# Patient Record
Sex: Male | Born: 1955 | Race: White | Hispanic: No | Marital: Married | Smoking: Current every day smoker
Health system: Southern US, Community
[De-identification: ages and names within clinical notes are randomized; demographics above are authoritative.]

## PROBLEM LIST (undated history)

## (undated) DIAGNOSIS — R7303 Prediabetes: Secondary | ICD-10-CM

## (undated) DIAGNOSIS — N529 Male erectile dysfunction, unspecified: Secondary | ICD-10-CM

## (undated) DIAGNOSIS — E785 Hyperlipidemia, unspecified: Secondary | ICD-10-CM

## (undated) DIAGNOSIS — K635 Polyp of colon: Secondary | ICD-10-CM

## (undated) DIAGNOSIS — K219 Gastro-esophageal reflux disease without esophagitis: Secondary | ICD-10-CM

## (undated) HISTORY — DX: Prediabetes: R73.03

## (undated) HISTORY — DX: Male erectile dysfunction, unspecified: N52.9

## (undated) HISTORY — DX: Hyperlipidemia, unspecified: E78.5

## (undated) HISTORY — DX: Polyp of colon: K63.5

## (undated) HISTORY — DX: Gastro-esophageal reflux disease without esophagitis: K21.9

---
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
## 2001-09-24 ENCOUNTER — Encounter: Payer: Self-pay | Admitting: Emergency Medicine

## 2001-09-24 ENCOUNTER — Emergency Department (HOSPITAL_COMMUNITY): Admission: EM | Admit: 2001-09-24 | Discharge: 2001-09-24 | Payer: Self-pay | Admitting: Emergency Medicine

## 2012-10-10 ENCOUNTER — Ambulatory Visit (INDEPENDENT_AMBULATORY_CARE_PROVIDER_SITE_OTHER): Payer: 59 | Admitting: Internal Medicine

## 2012-10-10 ENCOUNTER — Encounter: Payer: Self-pay | Admitting: Internal Medicine

## 2012-10-10 VITALS — BP 136/84 | HR 62 | Temp 97.7°F | Ht 69.5 in | Wt 243.0 lb

## 2012-10-10 DIAGNOSIS — E785 Hyperlipidemia, unspecified: Secondary | ICD-10-CM

## 2012-10-10 DIAGNOSIS — F17200 Nicotine dependence, unspecified, uncomplicated: Secondary | ICD-10-CM

## 2012-10-10 DIAGNOSIS — K219 Gastro-esophageal reflux disease without esophagitis: Secondary | ICD-10-CM

## 2012-10-10 DIAGNOSIS — Z720 Tobacco use: Secondary | ICD-10-CM

## 2012-10-10 DIAGNOSIS — K635 Polyp of colon: Secondary | ICD-10-CM | POA: Insufficient documentation

## 2012-10-10 DIAGNOSIS — D126 Benign neoplasm of colon, unspecified: Secondary | ICD-10-CM

## 2012-10-10 DIAGNOSIS — N529 Male erectile dysfunction, unspecified: Secondary | ICD-10-CM | POA: Insufficient documentation

## 2012-10-10 MED ORDER — VARENICLINE TARTRATE 1 MG PO TABS: 1.0000 mg | ORAL_TABLET | Freq: Two times a day (BID) | ORAL | Status: DC

## 2012-10-10 MED ORDER — SILDENAFIL CITRATE 100 MG PO TABS: 50.0000 mg | ORAL_TABLET | Freq: Every day | ORAL | Status: DC | PRN

## 2012-10-10 MED ORDER — VARENICLINE TARTRATE 0.5 MG X 11 & 1 MG X 42 PO MISC: ORAL | Status: DC

## 2012-10-10 NOTE — Assessment & Plan Note (Signed)
He has a family history of colon polyps, reports he had a total of 4 colonoscopies before, due for one. Will refer to his previous GI in [facility] gastroenterology.

## 2012-10-10 NOTE — Assessment & Plan Note (Signed)
Has used Viagra before without problems, request a refill: done

## 2012-10-10 NOTE — Assessment & Plan Note (Signed)
Patient used Chantix before with minimal side effects, it did help. Would like to have a prescription to use when he is ready. Prescription issued.

## 2012-10-10 NOTE — Patient Instructions (Addendum)
Please sign a ROI and send it to your previous MD, get all records Next visit 4 months, fasting for a physical Quit tobacco 2 weeks after start chantix, ok to take chantix for 3 months; call if side effects

## 2012-10-10 NOTE — Assessment & Plan Note (Addendum)
GERD symptoms well controlled. Recommend to take Prevacid in the morning before breakfast Instead of taking it at night. Reports that previously had a EGD the "showed no Barrett esophagus".

## 2012-10-10 NOTE — Progress Notes (Signed)
  Subjective:    Patient ID: Steven Mcdaniel, male    DOB: [birth year], 57 y.o.   MRN: [id number]  HPI  New patient, here to get established, we went over his medical issues: GERD, symptoms well-controlled with Prevacid at night. History of ED, previously tried Viagra without problems, would like a prescription. History of mildly elevated cholesterol, at some point took Lipitor. His or colon polyps, thinks he is due for a colonoscopy. Ongoing tobacco abuse, tried Chantix before, would like a prescription, reports it worked for him w/ no major side effects except for some "weird dreams"..  Past Medical History  Diagnosis Date  . GERD (gastroesophageal reflux disease)   . Colon polyp   . Erectile dysfunction   . Mild hyperlipidemia    Past Surgical History  Procedure Laterality Date  . Tonsillectomy and adenoidectomy  1968   History   Social History  . Marital Status: Married    Spouse Name: N/A    Number of Children: 4  . Years of Education: N/A   Occupational History  . Administrator, Civil Service, Financial trader     Social History Main Topics  . Smoking status: Current Every Day Smoker  . Smokeless tobacco: Never Used     Comment: 3/4 ppd  . Alcohol Use: No  . Drug Use: No  . Sexually Active: Not on file   Other Topics Concern  . Not on file   Social History Narrative  . No narrative on file   Family History  Problem Relation Age of Onset  . Colon cancer Other     GM age 66 ?  Marland Kitchen Prostate cancer Neg Hx   . Diabetes Brother   . CAD Father     CABG at age 61s   Review of Systems Denies nausea, vomiting, diarrhea. No blood in the stools. No dysphasia or odynophagia.    Objective:   Physical Exam BP 136/84  Pulse 62  Temp(Src) 97.7 F (36.5 C) (Oral)  Ht 5' 9.5" (1.765 m)  Wt 243 lb (110.224 kg)  BMI 35.38 kg/m2  SpO2 95%  General -- alert, well-developed, No apparent distress   Neck --no thyromegaly , normal carotid pulse Lungs -- normal respiratory effort, no  intercostal retractions, no accessory muscle use, and normal breath sounds.   Heart-- normal rate, regular rhythm, no murmur, and no gallop.   Abdomen--soft, non-tender, no distention, no masses, no HSM, no guarding, and no rigidity.   Extremities-- no pretibial edema bilaterally  Neurologic-- alert & oriented X3 and strength normal in all extremities. Psych-- Cognition and judgment appear intact. Alert and cooperative with normal attention span and concentration.  not anxious appearing and not depressed appearing.          Assessment & Plan:

## 2012-10-11 ENCOUNTER — Encounter: Payer: Self-pay | Admitting: Internal Medicine

## 2012-10-21 ENCOUNTER — Encounter: Payer: 59 | Admitting: Internal Medicine

## 2012-11-07 ENCOUNTER — Encounter: Payer: 59 | Admitting: Internal Medicine

## 2013-01-01 ENCOUNTER — Encounter: Payer: Self-pay | Admitting: Internal Medicine

## 2013-02-23 ENCOUNTER — Encounter: Payer: 59 | Admitting: Internal Medicine

## 2013-03-05 ENCOUNTER — Telehealth: Payer: Self-pay

## 2013-03-05 NOTE — Telephone Encounter (Signed)
LVM for [facility] needs updating

## 2013-03-06 ENCOUNTER — Ambulatory Visit (INDEPENDENT_AMBULATORY_CARE_PROVIDER_SITE_OTHER)
Admission: RE | Admit: 2013-03-06 | Discharge: 2013-03-06 | Disposition: A | Discharge status: Home / Self Care | Payer: 59 | Source: Ambulatory Visit | Attending: Internal Medicine | Admitting: Internal Medicine

## 2013-03-06 ENCOUNTER — Ambulatory Visit (INDEPENDENT_AMBULATORY_CARE_PROVIDER_SITE_OTHER): Payer: 59 | Admitting: Internal Medicine

## 2013-03-06 ENCOUNTER — Encounter: Payer: Self-pay | Admitting: Internal Medicine

## 2013-03-06 VITALS — BP 143/93 | HR 65 | Temp 98.1°F | Wt 248.0 lb

## 2013-03-06 DIAGNOSIS — Z0000 Encounter for general adult medical examination without abnormal findings: Secondary | ICD-10-CM | POA: Insufficient documentation

## 2013-03-06 DIAGNOSIS — R059 Cough, unspecified: Secondary | ICD-10-CM

## 2013-03-06 DIAGNOSIS — Z720 Tobacco use: Secondary | ICD-10-CM

## 2013-03-06 DIAGNOSIS — R05 Cough: Secondary | ICD-10-CM

## 2013-03-06 DIAGNOSIS — Z23 Encounter for immunization: Secondary | ICD-10-CM

## 2013-03-06 DIAGNOSIS — R079 Chest pain, unspecified: Secondary | ICD-10-CM | POA: Insufficient documentation

## 2013-03-06 LAB — CBC WITH DIFFERENTIAL/PLATELET
Basophils Absolute: 0 10*3/uL (ref 0.0–0.1)
Basophils Relative: 0.4 % (ref 0.0–3.0)
Eosinophils Absolute: 0.4 10*3/uL (ref 0.0–0.7)
Eosinophils Relative: 3.6 % (ref 0.0–5.0)
HCT: 48.3 % (ref 39.0–52.0)
Hemoglobin: 16.6 g/dL (ref 13.0–17.0)
Lymphocytes Relative: 19.9 % (ref 12.0–46.0)
Lymphs Abs: 2 10*3/uL (ref 0.7–4.0)
MCHC: 34.3 g/dL (ref 30.0–36.0)
MCV: 95.1 fl (ref 78.0–100.0)
Monocytes Absolute: 0.4 10*3/uL (ref 0.1–1.0)
Monocytes Relative: 4 % (ref 3.0–12.0)
Neutro Abs: 7.3 10*3/uL (ref 1.4–7.7)
Neutrophils Relative %: 72.1 % (ref 43.0–77.0)
Platelets: 243 10*3/uL (ref 150.0–400.0)
RBC: 5.08 Mil/uL (ref 4.22–5.81)
RDW: 13.6 % (ref 11.5–14.6)
WBC: 10.1 10*3/uL (ref 4.5–10.5)

## 2013-03-06 LAB — COMPREHENSIVE METABOLIC PANEL
ALT: 23 U/L (ref 0–53)
AST: 21 U/L (ref 0–37)
Albumin: 3.9 g/dL (ref 3.5–5.2)
Alkaline Phosphatase: 77 U/L (ref 39–117)
BUN: 16 mg/dL (ref 6–23)
CO2: 25 mEq/L (ref 19–32)
Calcium: 9 mg/dL (ref 8.4–10.5)
Chloride: 108 mEq/L (ref 96–112)
Creatinine, Ser: 0.9 mg/dL (ref 0.4–1.5)
GFR: 96.67 mL/min (ref >60.00)
Glucose, Bld: 104 mg/dL — ABNORMAL HIGH (ref 70–99)
Potassium: 4.1 mEq/L (ref 3.5–5.1)
Sodium: 139 mEq/L (ref 135–145)
Total Bilirubin: 0.8 mg/dL (ref 0.3–1.2)
Total Protein: 6.8 g/dL (ref 6.0–8.3)

## 2013-03-06 LAB — LIPID PANEL
Cholesterol: 219 mg/dL — ABNORMAL HIGH (ref 0–200)
HDL: 39.7 mg/dL (ref >39.00)
Total CHOL/HDL Ratio: 6
Triglycerides: 111 mg/dL (ref 0.0–149.0)
VLDL: 22.2 mg/dL (ref 0.0–40.0)

## 2013-03-06 LAB — LDL CHOLESTEROL, DIRECT: Direct LDL: 169.2 mg/dL

## 2013-03-06 LAB — HEMOGLOBIN A1C: Hgb A1c MFr Bld: 6.2 % (ref 4.6–6.5)

## 2013-03-06 LAB — TSH: TSH: 1.08 u[IU]/mL (ref 0.35–5.50)

## 2013-03-06 LAB — PSA: PSA: 1.4 ng/mL (ref 0.10–4.00)

## 2013-03-06 MED ORDER — BUPROPION HCL 100 MG PO TABS: 100.0000 mg | ORAL_TABLET | Freq: Two times a day (BID) | ORAL | Status: DC

## 2013-03-06 NOTE — Assessment & Plan Note (Addendum)
Tdap-- today Flu shot today PNM shot-- will provide one (smoker) next visit H/o previous colonoscopies, we referred to GI, see answer below: [facility] [phone]) pt is not due until 05/2014 for colon.Spoke with pt and he is aware that he's will be receiving reminder in mail when he is due and that Dr Donnie Coffin retired in aug 2013 Labs including a  A1c patient request. Patient is overweight, sedentary, smokes, has a family history of heart disease, he is high risk for cardiovascular events, patient aware of that.  Start aspirin 81.

## 2013-03-06 NOTE — Patient Instructions (Signed)
Get your blood work before you leave  Next visit in  3-4 months  for a check up Please make an appointment before you leave the office today (or call few weeks in advance)  Please get your x-ray at the other [place]  office located at: [address] [place], across from [facility].  Please go to the basement, this is a walk-in facility, they are open from 8:30 to 5:30 PM. Phone number [phone].

## 2013-03-06 NOTE — Progress Notes (Signed)
  Subjective:    Patient ID: Steven Mcdaniel, male    DOB: 09/20/1955, 57 y.o.   MRN: [id number]  HPI  Complete physical exam In addition we discuss tobacco cessation, chest pain, cough  Past Medical History  Diagnosis Date  . GERD (gastroesophageal reflux disease)   . Colon polyp   . Erectile dysfunction   . Mild hyperlipidemia    Past Surgical History  Procedure Laterality Date  . Tonsillectomy and adenoidectomy  1968   History   Social History  . Marital Status: Married    Spouse Name: N/A    Number of Children: 4  . Years of Education: N/A   Occupational History  . Administrator, Civil Service, Financial trader     Social History Main Topics  . Smoking status: Current Every Day Smoker  . Smokeless tobacco: Never Used     Comment: 3/4 ppd  . Alcohol Use: No  . Drug Use: No  . Sexual Activity: Not on file   Other Topics Concern  . Not on file   Social History Narrative  . No narrative on file   Family History  Problem Relation Age of Onset  . Colon cancer Other     GM age 45 ?  Marland Kitchen Prostate cancer Neg Hx   . Diabetes Brother   . CAD Father     CABG at age 40s     Review of Systems Diet-- "not good" Exercise-- not as active lately but takes walks w/ wife 3-4 times per week Occasional chest pain, anteriorly, left or right, going on for 2 or 3 years, decrease with a "massaging"  the area.No exertional symptoms No SOB, lower extremity edema  No nausea, vomiting diarrhea No blood in the stools +  Cough "all day long" , mild sputum production, white-yellow occ  Wheezing,"rateling". No hemoptysis No dysuria, gross hematuria, difficulty urinating         Objective:   Physical Exam BP 143/93  Pulse 65  Temp(Src) 98.1 F (36.7 C)  Wt 248 lb (112.492 kg)  BMI 36.11 kg/m2  SpO2 96% General -- alert, well-developed, NAD.  Neck --no thyromegaly , normal carotid pulse  Lungs -- normal respiratory effort, no intercostal retractions, no accessory muscle use, and normal  breath sounds.  Heart-- normal rate, regular rhythm, no murmur.  Abdomen-- Not distended, good bowel sounds,soft, slt tender at the distal RLQ w/o mass-rebound. Noorganomegaly.  Rectal-- No external abnormalities noted. Normal sphincter tone. No rectal masses or tenderness. Brown stool  Prostate--Prostate gland firm and smooth, no enlargement, nodularity, tenderness, mass, asymmetry or induration. Extremities-- no pretibial edema bilaterally  Neurologic--  alert & oriented X3.  Speech normal, gait normal, strength normal in all extremities.   Psych-- Cognition and judgment appear intact. Cooperative with normal attention span and concentration. No anxious appearing , no depressed appearing.       Assessment & Plan:

## 2013-03-06 NOTE — Assessment & Plan Note (Signed)
Chest pain is quite atypical, EKG: i RBBB. He has multiple cardiovascular risk factors and request a stress test: Will arrange

## 2013-03-06 NOTE — Telephone Encounter (Signed)
Unable to reach pre visit.  

## 2013-03-06 NOTE — Assessment & Plan Note (Addendum)
Unable to afford Chantix due to cost We talked about  Wellbutrin, the patch. Elected welbutrin, Prescription provided, how to use the medication discussed as well

## 2013-03-07 ENCOUNTER — Encounter: Payer: Self-pay | Admitting: Internal Medicine

## 2013-03-09 ENCOUNTER — Telehealth: Payer: Self-pay | Admitting: *Deleted

## 2013-03-09 NOTE — Telephone Encounter (Signed)
Pt notified via tele of lab results. Pt states does not want referral to nutritionist, has a check up scheduled jan. 2015. DJR

## 2013-03-09 NOTE — Telephone Encounter (Signed)
Message copied by Eustace Quail on Mon Mar 09, 2013  2:53 PM ------      Message from: Wanda Plump      Created: Sat Mar 07, 2013  2:49 PM       Please call patient:      His liver, kidney, potassium, thyroid and prostate tests are all normal.      Chest x-ray is normal as well.      The A1c test did show  mild diabetes and his LDL or bad cholesterol is 147,WG should be close to 100.      I don't think he needs medication for his mild diabetes and high cholesterol  but definitely needs to improve his diet and exercise. If he needs to be referred to a nutritionist, please arrange. Otherwise I'll see him in 3 or 4 months for a checkup.      Marland Kitchen ------

## 2013-03-25 ENCOUNTER — Encounter: Payer: Self-pay | Admitting: Internal Medicine

## 2013-04-02 ENCOUNTER — Other Ambulatory Visit: Payer: Self-pay | Admitting: Internal Medicine

## 2013-04-02 ENCOUNTER — Ambulatory Visit (INDEPENDENT_AMBULATORY_CARE_PROVIDER_SITE_OTHER): Payer: 59 | Admitting: Internal Medicine

## 2013-04-02 DIAGNOSIS — R079 Chest pain, unspecified: Secondary | ICD-10-CM

## 2013-04-02 DIAGNOSIS — R05 Cough: Secondary | ICD-10-CM

## 2013-04-02 DIAGNOSIS — R059 Cough, unspecified: Secondary | ICD-10-CM

## 2013-04-02 LAB — PULMONARY FUNCTION TEST

## 2013-04-02 NOTE — Progress Notes (Signed)
PFT done today. 

## 2013-04-11 ENCOUNTER — Telehealth: Payer: Self-pay | Admitting: Internal Medicine

## 2013-04-11 NOTE — Telephone Encounter (Signed)
Advise patient, PFTs from 04/02/2013 normal. Good results

## 2013-04-13 NOTE — Telephone Encounter (Signed)
Pt notified. DJR  

## 2013-05-14 ENCOUNTER — Ambulatory Visit (INDEPENDENT_AMBULATORY_CARE_PROVIDER_SITE_OTHER): Payer: 59 | Admitting: Physician Assistant

## 2013-05-14 DIAGNOSIS — R079 Chest pain, unspecified: Secondary | ICD-10-CM

## 2013-05-14 NOTE — Progress Notes (Signed)
Exercise Treadmill Test  Steven Mcdaniel is a 57 y.o. male smoker with HL referred for ETT due to CP.  CP occurs with eating.  No exertional CP.  No DOE. No syncope.  Exam unremarkable.  ECG:  NSR, no ST changes.  Pre-Exercise Testing Evaluation Rhythm: normal sinus  Rate: 70 bpm     Test  Exercise Tolerance Test Ordering MD: Angelina Sheriff, MD  Interpreting MD: Tereso Newcomer PA-C  Unique Test No: 1  Treadmill:  1  Indication for ETT: chest pain - rule out ischemia  Contraindication to ETT: No   Stress Modality: exercise - treadmill  Cardiac Imaging Performed: non   Protocol: standard Bruce - maximal  Max BP:  207/87  Max MPHR (bpm):  161 85% MPR (bpm):  137  MPHR obtained (bpm):  171 % MPHR obtained:  106  Reached 85% MPHR (min:sec):  7:17 Total Exercise Time (min-sec):  8:14  Workload in METS:  9.9 Borg Scale: 15  Reason ETT Terminated:  SVT    ST Segment Analysis At Rest: normal ST segments - no evidence of significant ST depression With Exercise: non-specific ST changes  Other Information Arrhythmia:  Yes Angina during ETT:  absent (0) Quality of ETT:  diagnostic  ETT Interpretation:  normal - no evidence of ischemia by ST analysis  Comments: Good exercise capacity. No chest pain. Normal BP response to exercise. No ST changes to suggest ischemia.  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  Recommendations: F/u with Willow Ora, MD as directed. Consider event monitor if patient has episodes of rapid palpitations. Signed,  Tereso Newcomer, PA-C   05/14/2013 5:12 PM

## 2013-05-15 ENCOUNTER — Telehealth: Payer: Self-pay | Admitting: Internal Medicine

## 2013-05-15 NOTE — Telephone Encounter (Signed)
Advise patient, stress test negative, recommend to keep his followup  W/ me  06-2013

## 2013-05-18 NOTE — Telephone Encounter (Signed)
Pt notified , DJR

## 2013-05-24 ENCOUNTER — Telehealth: Payer: Self-pay | Admitting: Internal Medicine

## 2013-05-24 NOTE — Telephone Encounter (Signed)
Reviewed 100s of pages of old records, many more than 59 years old, will send them back to the pt for safe keeping

## 2013-06-19 ENCOUNTER — Ambulatory Visit: Payer: 59 | Admitting: Internal Medicine

## 2013-07-07 ENCOUNTER — Ambulatory Visit: Payer: 59 | Admitting: Internal Medicine

## 2013-07-23 ENCOUNTER — Ambulatory Visit (INDEPENDENT_AMBULATORY_CARE_PROVIDER_SITE_OTHER): Payer: 59 | Admitting: Internal Medicine

## 2013-07-23 ENCOUNTER — Encounter: Payer: Self-pay | Admitting: Internal Medicine

## 2013-07-23 VITALS — BP 144/84 | HR 68 | Temp 98.0°F | Wt 248.0 lb

## 2013-07-23 DIAGNOSIS — Z720 Tobacco use: Secondary | ICD-10-CM

## 2013-07-23 DIAGNOSIS — R7309 Other abnormal glucose: Secondary | ICD-10-CM

## 2013-07-23 DIAGNOSIS — E785 Hyperlipidemia, unspecified: Secondary | ICD-10-CM

## 2013-07-23 DIAGNOSIS — R7303 Prediabetes: Secondary | ICD-10-CM

## 2013-07-23 DIAGNOSIS — R05 Cough: Secondary | ICD-10-CM

## 2013-07-23 DIAGNOSIS — F17200 Nicotine dependence, unspecified, uncomplicated: Secondary | ICD-10-CM

## 2013-07-23 DIAGNOSIS — R079 Chest pain, unspecified: Secondary | ICD-10-CM

## 2013-07-23 DIAGNOSIS — R059 Cough, unspecified: Secondary | ICD-10-CM

## 2013-07-23 DIAGNOSIS — Z23 Encounter for immunization: Secondary | ICD-10-CM

## 2013-07-23 MED ORDER — ATORVASTATIN CALCIUM 20 MG PO TABS: 20.0000 mg | ORAL_TABLET | Freq: Every day | ORAL | Status: DC

## 2013-07-23 MED ORDER — SILDENAFIL CITRATE 100 MG PO TABS: 50.0000 mg | ORAL_TABLET | Freq: Every day | ORAL | Status: DC | PRN

## 2013-07-23 NOTE — Assessment & Plan Note (Signed)
Symptoms decreasing, a stress test was negative.

## 2013-07-23 NOTE — Assessment & Plan Note (Signed)
Tried Wellbutrin, did not help, already d/c it it. Is trying to quit on his own, has cut down his tobacco use.

## 2013-07-23 NOTE — Assessment & Plan Note (Signed)
a1c  6.2, concept of prediabetes discussed, for now I encouraged diet and exercise

## 2013-07-23 NOTE — Patient Instructions (Signed)
Start Lipitor 20 mg one tablet at bedtime  Come back fasting in 6 weeks for labs only : FLP, AST, ALT ---- hyperlipidemia  Next visit for a routine checkup in 4 months, please make an appointment

## 2013-07-23 NOTE — Progress Notes (Signed)
   Subjective:    Patient ID: Steven Mcdaniel, male    DOB: 1956-05-28, 58 y.o.   MRN: [id number]  DOS:  07/23/2013 Followup from previous visit  Since the last time he was here, several tests were done, all tests were reviewed with the patient, see assessment and plan.   Past Medical History  Diagnosis Date  . GERD (gastroesophageal reflux disease)   . Colon polyp   . Erectile dysfunction   . Mild hyperlipidemia     Past Surgical History  Procedure Laterality Date  . Tonsillectomy and adenoidectomy  1968    History   Social History  . Marital Status: Married    Spouse Name: N/A    Number of Children: 4  . Years of Education: N/A   Occupational History  . Geophysical data processor, Therapist, nutritional     Social History Main Topics  . Smoking status: Current Every Day Smoker  . Smokeless tobacco: Never Used     Comment: 3/4 ppd  . Alcohol Use: No  . Drug Use: No  . Sexual Activity: Not on file   Other Topics Concern  . Not on file   Social History Narrative  . No narrative on file    ROS Diet-exercise-- did poorly during Christmas but is trying to do better, exercise - treadmill-  every other day. Exercise somehow limited by knee pain, right side. Did try Wellbutrin, did not help any, she self discontinued, has decreased from 15 to approximately 12 cigarettes a day. He complained of chest pain the last time he was here, that is better    Objective:   Physical Exam BP 144/84  Pulse 68  Temp(Src) 98 F (36.7 C)  Wt 248 lb (112.492 kg)  SpO2 97%  General -- alert, well-developed, NAD.  Extremities-- no pretibial edema bilaterally ; Knees with some deformities consistent with DJD, no effusion redness or warmness Neurologic--  alert & oriented X3. Speech normal, gait normal, strength normal in all extremities.   Psych-- Cognition and judgment appear intact. Cooperative with normal attention span and concentration. No anxious or depressed appearing.      Assessment &  Plan:   Knee pain:  Recommend a knee sleeve, judicious use of Motrin, Tylenol and ice after treadmill.

## 2013-07-23 NOTE — Assessment & Plan Note (Addendum)
Last LDL 169, given cardiovascular risk factors, LDL goal is ~ 100. Today we again discussed diet, exercise, in addition will try Lipitor 20 mg ( in the past he took Lipitor without apparent side effects) See instructions.

## 2013-07-23 NOTE — Progress Notes (Signed)
Pre visit review using our clinic review tool, if applicable. No additional management support is needed unless otherwise documented below in the visit note. 

## 2013-07-23 NOTE — Assessment & Plan Note (Signed)
PFTs were negative. Chest x-ray negative

## 2013-07-24 ENCOUNTER — Telehealth: Payer: Self-pay | Admitting: Internal Medicine

## 2013-07-24 NOTE — Telephone Encounter (Signed)
Relevant patient education assigned to patient using Emmi. ° °

## 2013-09-03 ENCOUNTER — Other Ambulatory Visit: Payer: 59

## 2013-09-09 ENCOUNTER — Other Ambulatory Visit: Payer: 59

## 2013-09-16 ENCOUNTER — Other Ambulatory Visit (INDEPENDENT_AMBULATORY_CARE_PROVIDER_SITE_OTHER): Payer: 59

## 2013-09-16 DIAGNOSIS — E785 Hyperlipidemia, unspecified: Secondary | ICD-10-CM

## 2013-09-17 LAB — LIPID PANEL
CHOL/HDL RATIO: 4
Cholesterol: 149 mg/dL (ref 0–200)
HDL: 35.3 mg/dL — AB (ref >39.00)
LDL CALC: 80 mg/dL (ref 0–99)
Triglycerides: 169 mg/dL — ABNORMAL HIGH (ref 0.0–149.0)
VLDL: 33.8 mg/dL (ref 0.0–40.0)

## 2013-09-17 LAB — AST: AST: 25 U/L (ref 0–37)

## 2013-09-17 LAB — ALT: ALT: 28 U/L (ref 0–53)

## 2013-09-21 ENCOUNTER — Encounter: Payer: Self-pay | Admitting: *Deleted

## 2013-09-21 ENCOUNTER — Other Ambulatory Visit: Payer: Self-pay | Admitting: *Deleted

## 2013-09-21 MED ORDER — ATORVASTATIN CALCIUM 20 MG PO TABS: 20.0000 mg | ORAL_TABLET | Freq: Every day | ORAL | Status: DC

## 2013-11-24 ENCOUNTER — Ambulatory Visit: Payer: 59 | Admitting: Internal Medicine

## 2013-12-02 ENCOUNTER — Encounter: Payer: Self-pay | Admitting: *Deleted

## 2013-12-02 ENCOUNTER — Encounter: Payer: Self-pay | Admitting: Internal Medicine

## 2013-12-02 ENCOUNTER — Ambulatory Visit (INDEPENDENT_AMBULATORY_CARE_PROVIDER_SITE_OTHER): Payer: 59 | Admitting: Internal Medicine

## 2013-12-02 VITALS — BP 127/71 | HR 70 | Temp 98.3°F | Wt 245.0 lb

## 2013-12-02 DIAGNOSIS — M25512 Pain in left shoulder: Secondary | ICD-10-CM

## 2013-12-02 DIAGNOSIS — E785 Hyperlipidemia, unspecified: Secondary | ICD-10-CM

## 2013-12-02 DIAGNOSIS — R7303 Prediabetes: Secondary | ICD-10-CM

## 2013-12-02 DIAGNOSIS — R7309 Other abnormal glucose: Secondary | ICD-10-CM

## 2013-12-02 DIAGNOSIS — M25519 Pain in unspecified shoulder: Secondary | ICD-10-CM

## 2013-12-02 DIAGNOSIS — M2550 Pain in unspecified joint: Secondary | ICD-10-CM | POA: Insufficient documentation

## 2013-12-02 DIAGNOSIS — F17200 Nicotine dependence, unspecified, uncomplicated: Secondary | ICD-10-CM

## 2013-12-02 DIAGNOSIS — Z720 Tobacco use: Secondary | ICD-10-CM

## 2013-12-02 LAB — HEMOGLOBIN A1C: Hgb A1c MFr Bld: 5.9 % (ref 4.6–6.5)

## 2013-12-02 MED ORDER — VARENICLINE TARTRATE 0.5 MG X 11 & 1 MG X 42 PO MISC: ORAL | Status: DC

## 2013-12-02 MED ORDER — VARENICLINE TARTRATE 1 MG PO TABS: 1.0000 mg | ORAL_TABLET | Freq: Two times a day (BID) | ORAL | Status: DC

## 2013-12-02 NOTE — Assessment & Plan Note (Signed)
Chronic left shoulder pain after an injury more than 10 years ago, request for orthopedic referral, will do

## 2013-12-02 NOTE — Patient Instructions (Signed)
Get your blood work before you leave   Next visit is for a physical exam by 02-2014, fasting Please make an appointment

## 2013-12-02 NOTE — Assessment & Plan Note (Signed)
Interested on taking Chantix. Prescriptions provided. How to use it and how it works discuss. To discontinue tobacco 2 weeks after initiation of Chantix and to avoid nicotine supplements

## 2013-12-02 NOTE — Assessment & Plan Note (Signed)
Labs reviewed, good response to Lipitor 20 mg

## 2013-12-02 NOTE — Progress Notes (Signed)
Subjective:    Patient ID: Steven Mcdaniel, male    DOB: 02-Jun-1956, 58 y.o.   MRN: [id number]  DOS:  12/02/2013 Type of  Visit: Routine History: Cough is still there, he is still smoking, interested in Chantix High cholesterol, on Lipitor, no apparent side effects, follow up cholesterol panel improved. History of pre diabetes, he remains active at home and at work, playing golf sometimes but has not time for routine exercise. More than 10 years ago had a left shoulder injury, has chronic pain gradually increasing, request a referral to orthopedic surgery     Past Medical History  Diagnosis Date  . GERD (gastroesophageal reflux disease)   . Colon polyp   . Erectile dysfunction   . Mild hyperlipidemia   . Prediabetes     Past Surgical History  Procedure Laterality Date  . Tonsillectomy and adenoidectomy  1968    History   Social History  . Marital Status: Married    Spouse Name: N/A    Number of Children: 4  . Years of Education: N/A   Occupational History  . Geophysical data processor, Therapist, nutritional     Social History Main Topics  . Smoking status: Current Every Day Smoker  . Smokeless tobacco: Never Used     Comment: 3/4 ppd  . Alcohol Use: No  . Drug Use: No  . Sexual Activity: Not on file   Other Topics Concern  . Not on file   Social History Narrative  . No narrative on file        Medication List       This list is accurate as of: 12/02/13 11:59 PM.  Always use your most recent med list.               aspirin 81 MG tablet  Take 81 mg by mouth daily.     atorvastatin 20 MG tablet  Commonly known as:  LIPITOR  Take 1 tablet (20 mg total) by mouth daily.     PREVACID PO  Take 1 tablet by mouth daily.     sildenafil 100 MG tablet  Commonly known as:  VIAGRA  Take 0.5-1 tablets (50-100 mg total) by mouth daily as needed for erectile dysfunction.     varenicline 0.5 MG X 11 & 1 MG X 42 tablet  Commonly known as:  CHANTIX STARTING MONTH PAK  Take  one 0.5 mg tablet by mouth once daily for 3 days, then increase to one 0.5 mg tablet twice daily for 4 days, then increase to one 1 mg tablet twice daily.     varenicline 1 MG tablet  Commonly known as:  CHANTIX  Take 1 tablet (1 mg total) by mouth 2 (two) times daily.           Objective:   Physical Exam BP 127/71  Pulse 70  Temp(Src) 98.3 F (36.8 C)  Wt 245 lb (111.131 kg)  SpO2 94%  General -- alert, well-developed, NAD.  Lungs -- normal respiratory effort, no intercostal retractions, no accessory muscle use, and normal breath sounds.  Heart-- normal rate, regular rhythm, no murmur.  Extremities-- no pretibial edema bilaterally  Neurologic--  alert & oriented X3. Speech normal, gait appropriate for age, strength symmetric and appropriate for age.   Psych-- Cognition and judgment appear intact. Cooperative with normal attention span and concentration. No anxious or depressed appearing.     Assessment & Plan:  Today , I spent more than  15  min with the  patient: >50% of the time counseling regards tobacco abuse, chantix use Also coordinating his care

## 2013-12-02 NOTE — Assessment & Plan Note (Signed)
Again discussed the need to diet and exercise, will check the A1c

## 2013-12-02 NOTE — Progress Notes (Signed)
Pre visit review using our clinic review tool, if applicable. No additional management support is needed unless otherwise documented below in the visit note. 

## 2014-03-15 ENCOUNTER — Ambulatory Visit (INDEPENDENT_AMBULATORY_CARE_PROVIDER_SITE_OTHER): Payer: 59 | Admitting: Internal Medicine

## 2014-03-15 ENCOUNTER — Encounter: Payer: Self-pay | Admitting: Internal Medicine

## 2014-03-15 VITALS — BP 130/85 | HR 79 | Temp 98.3°F | Ht 70.0 in | Wt 248.4 lb

## 2014-03-15 DIAGNOSIS — M25519 Pain in unspecified shoulder: Secondary | ICD-10-CM

## 2014-03-15 DIAGNOSIS — Z0000 Encounter for general adult medical examination without abnormal findings: Secondary | ICD-10-CM

## 2014-03-15 DIAGNOSIS — M2550 Pain in unspecified joint: Secondary | ICD-10-CM

## 2014-03-15 DIAGNOSIS — E785 Hyperlipidemia, unspecified: Secondary | ICD-10-CM

## 2014-03-15 DIAGNOSIS — Z720 Tobacco use: Secondary | ICD-10-CM

## 2014-03-15 DIAGNOSIS — R7309 Other abnormal glucose: Secondary | ICD-10-CM

## 2014-03-15 DIAGNOSIS — R7303 Prediabetes: Secondary | ICD-10-CM

## 2014-03-15 LAB — CBC WITH DIFFERENTIAL/PLATELET
Basophils Absolute: 0.1 10*3/uL (ref 0.0–0.1)
Basophils Relative: 0.6 % (ref 0.0–3.0)
EOS PCT: 2.8 % (ref 0.0–5.0)
Eosinophils Absolute: 0.3 10*3/uL (ref 0.0–0.7)
HCT: 49.7 % (ref 39.0–52.0)
Hemoglobin: 16.6 g/dL (ref 13.0–17.0)
Lymphocytes Relative: 22.9 % (ref 12.0–46.0)
Lymphs Abs: 2.4 10*3/uL (ref 0.7–4.0)
MCHC: 33.5 g/dL (ref 30.0–36.0)
MCV: 98.8 fl (ref 78.0–100.0)
MONO ABS: 0.5 10*3/uL (ref 0.1–1.0)
MONOS PCT: 5 % (ref 3.0–12.0)
NEUTROS PCT: 68.7 % (ref 43.0–77.0)
Neutro Abs: 7 10*3/uL (ref 1.4–7.7)
PLATELETS: 197 10*3/uL (ref 150.0–400.0)
RBC: 5.02 Mil/uL (ref 4.22–5.81)
RDW: 14 % (ref 11.5–15.5)
WBC: 10.3 10*3/uL (ref 4.0–10.5)

## 2014-03-15 LAB — BASIC METABOLIC PANEL
BUN: 15 mg/dL (ref 6–23)
CHLORIDE: 102 meq/L (ref 96–112)
CO2: 28 mEq/L (ref 19–32)
Calcium: 8.9 mg/dL (ref 8.4–10.5)
Creatinine, Ser: 1 mg/dL (ref 0.4–1.5)
GFR: 84.85 mL/min (ref >60.00)
Glucose, Bld: 116 mg/dL — ABNORMAL HIGH (ref 70–99)
Potassium: 4.3 mEq/L (ref 3.5–5.1)
Sodium: 140 mEq/L (ref 135–145)

## 2014-03-15 LAB — LIPID PANEL
Cholesterol: 183 mg/dL (ref 0–200)
HDL: 49.3 mg/dL (ref >39.00)
LDL CALC: 115 mg/dL — AB (ref 0–99)
NONHDL: 133.7
Total CHOL/HDL Ratio: 4
Triglycerides: 95 mg/dL (ref 0.0–149.0)
VLDL: 19 mg/dL (ref 0.0–40.0)

## 2014-03-15 LAB — PSA: PSA: 1.88 ng/mL (ref 0.10–4.00)

## 2014-03-15 LAB — HEMOGLOBIN A1C: HEMOGLOBIN A1C: 6.3 % (ref 4.6–6.5)

## 2014-03-15 MED ORDER — SILDENAFIL CITRATE 100 MG PO TABS: 50.0000 mg | ORAL_TABLET | Freq: Every day | ORAL | Status: AC | PRN

## 2014-03-15 MED ORDER — ATORVASTATIN CALCIUM 20 MG PO TABS: 20.0000 mg | ORAL_TABLET | Freq: Every day | ORAL | Status: DC

## 2014-03-15 NOTE — Progress Notes (Signed)
Subjective:    Patient ID: Steven Mcdaniel, male    DOB: 05/27/1956, 58 y.o.   MRN: [id number]  DOS:  03/15/2014 Type of visit - description : CPX Interval history: Having significant amount of back pain, see assessment and plan. Tried Chantix, did not help.   ROS Diet-- regular  Exercise-- very limited d/t back  pain  No  CP, SOB Denies  nausea, vomiting diarrhea, blood in the stools (-) cough, sputum production (-) wheezing, chest congestion No dysuria, gross hematuria, difficulty urinating  No anxiety, depression   Past Medical History  Diagnosis Date  . GERD (gastroesophageal reflux disease)   . Colon polyp   . Erectile dysfunction   . Mild hyperlipidemia   . Prediabetes     Past Surgical History  Procedure Laterality Date  . Tonsillectomy and adenoidectomy  1968    History   Social History  . Marital Status: Married    Spouse Name: N/A    Number of Children: 4  . Years of Education: N/A   Occupational History  . Geophysical data processor, Therapist, nutritional     Social History Main Topics  . Smoking status: Current Every Day Smoker  . Smokeless tobacco: Never Used     Comment: 3/4 ppd  . Alcohol Use: No  . Drug Use: No  . Sexual Activity: Not on file   Other Topics Concern  . Not on file   Social History Narrative   Lives w/ wife     Family History  Problem Relation Age of Onset  . Colon cancer Other     GM age 95 ?  Marland Kitchen Prostate cancer Neg Hx   . Diabetes Brother   . CAD Father     CABG at age 66s       Medication List       This list is accurate as of: 03/15/14  5:20 PM.  Always use your most recent med list.               aspirin 81 MG tablet  Take 81 mg by mouth daily.     atorvastatin 20 MG tablet  Commonly known as:  LIPITOR  Take 1 tablet (20 mg total) by mouth daily.     PREVACID PO  Take 1 tablet by mouth daily.     sildenafil 100 MG tablet  Commonly known as:  VIAGRA  Take 0.5-1 tablets (50-100 mg total) by mouth daily as  needed for erectile dysfunction.           Objective:   Physical Exam BP 130/85  Pulse 79  Temp(Src) 98.3 F (36.8 C) (Oral)  Ht 5\' 10"  (1.778 m)  Wt 248 lb 6 oz (112.662 kg)  BMI 35.64 kg/m2  SpO2 94%  General -- alert, well-developed, NAD.  Neck --no thyromegaly , normal carotid pulse, no LAD HEENT-- Not pale.  Lungs -- normal respiratory effort, no intercostal retractions, no accessory muscle use, and normal breath sounds.  Heart-- normal rate, regular rhythm, no murmur.  Abdomen-- Not distended, good bowel sounds,soft, non-tender. Rectal-- No external abnormalities noted. Normal sphincter tone. No rectal masses or tenderness. Stool brown  Prostate--Prostate gland firm and smooth, no enlargement, nodularity, tenderness, mass, asymmetry or induration. Extremities-- no pretibial edema bilaterally  Neurologic--  alert & oriented X3. Speech normal, gait appropriate for age, strength symmetric and appropriate for age.  Psych-- Cognition and judgment appear intact. Cooperative with normal attention span and concentration. No anxious or depressed appearing.  Assessment & Plan:

## 2014-03-15 NOTE — Assessment & Plan Note (Signed)
Good medication compliance, labs

## 2014-03-15 NOTE — Progress Notes (Signed)
Pre visit review using our clinic review tool, if applicable. No additional management support is needed unless otherwise documented below in the visit note. 

## 2014-03-15 NOTE — Patient Instructions (Addendum)
Please come back to the office in 6 months- for a routine check up , no  fasting      Schedule the visit at the front desk  REMINDERS It is extremely important to know what medications you take, please bring all bottles with you (or an accurate medication list) for every visit  If we are ordering labs, XRs or referring you to a specialist : we will always communicate to you the results or the time of the appointment within few days. If you don't hear from [place] please call the office   [place] with information about home physical therapy for back pain: [email or web address]

## 2014-03-15 NOTE — Assessment & Plan Note (Addendum)
Continue with shoulder pain, has also developed back pain, saw orthopedic surgery, status post prednisone, had a MRI recently, results pending. Plan: Followup with ortho

## 2014-03-15 NOTE — Assessment & Plan Note (Addendum)
Tdap-- 2014 Had a Flu shot   Had a PNM shot  H/o previous colonoscopies,  @ [place] [phone]) pt is not due until 05/2014 for colon  ( Dr Truddie Coco retired in aug 2013) He is high risk for cardiovascular events -- counseled  Labs  Discussed diet, exercise is very limited at this time

## 2014-03-15 NOTE — Assessment & Plan Note (Addendum)
Tried Chantix, did not help at all. Patient is counseled, recommend to keep trying and when ready is  Ok to try Chantix-patch-welbutrin  again

## 2014-03-16 MED ORDER — ATORVASTATIN CALCIUM 40 MG PO TABS
40.0000 mg | ORAL_TABLET | Freq: Every day | ORAL | Status: AC
Start: 2014-03-16 — End: ?

## 2014-03-16 NOTE — Addendum Note (Signed)
Addended by: Wilfrid Lund on: 03/16/2014 04:13 PM   Modules accepted: Orders, Medications

## 2014-03-22 ENCOUNTER — Other Ambulatory Visit: Payer: Self-pay | Admitting: Sports Medicine

## 2014-03-22 DIAGNOSIS — M545 Low back pain, unspecified: Secondary | ICD-10-CM

## 2014-03-22 DIAGNOSIS — G8929 Other chronic pain: Secondary | ICD-10-CM

## 2014-03-22 DIAGNOSIS — M5126 Other intervertebral disc displacement, lumbar region: Secondary | ICD-10-CM

## 2014-03-29 ENCOUNTER — Ambulatory Visit
Admission: RE | Admit: 2014-03-29 | Discharge: 2014-03-29 | Disposition: A | Discharge status: Home / Self Care | Payer: 59 | Source: Ambulatory Visit | Attending: Sports Medicine | Admitting: Sports Medicine

## 2014-03-29 VITALS — BP 138/86 | HR 63

## 2014-03-29 DIAGNOSIS — G8929 Other chronic pain: Secondary | ICD-10-CM

## 2014-03-29 DIAGNOSIS — M5126 Other intervertebral disc displacement, lumbar region: Secondary | ICD-10-CM

## 2014-03-29 DIAGNOSIS — M2550 Pain in unspecified joint: Secondary | ICD-10-CM

## 2014-03-29 DIAGNOSIS — M545 Low back pain: Secondary | ICD-10-CM

## 2014-03-29 MED ORDER — IOHEXOL 180 MG/ML  SOLN
1.0000 mL | Freq: Once | INTRAMUSCULAR | Status: AC | PRN
  Administered 2014-03-29: 1 mL via EPIDURAL

## 2014-03-29 MED ORDER — METHYLPREDNISOLONE ACETATE 40 MG/ML INJ SUSP (RADIOLOG
120.0000 mg | Freq: Once | INTRAMUSCULAR | Status: AC
  Administered 2014-03-29: 120 mg via EPIDURAL

## 2014-03-29 NOTE — Discharge Instructions (Signed)

## 2014-05-12 ENCOUNTER — Other Ambulatory Visit: Payer: Self-pay

## 2014-06-02 ENCOUNTER — Other Ambulatory Visit: Payer: Self-pay | Admitting: Orthopedic Surgery

## 2014-06-02 DIAGNOSIS — M545 Low back pain, unspecified: Secondary | ICD-10-CM

## 2014-06-02 DIAGNOSIS — M5126 Other intervertebral disc displacement, lumbar region: Secondary | ICD-10-CM

## 2014-06-10 ENCOUNTER — Ambulatory Visit
Admission: RE | Admit: 2014-06-10 | Discharge: 2014-06-10 | Disposition: A | Discharge status: Home / Self Care | Payer: 59 | Source: Ambulatory Visit | Attending: Orthopedic Surgery | Admitting: Orthopedic Surgery

## 2014-06-10 ENCOUNTER — Other Ambulatory Visit: Payer: Self-pay | Admitting: Orthopedic Surgery

## 2014-06-10 DIAGNOSIS — M5126 Other intervertebral disc displacement, lumbar region: Secondary | ICD-10-CM

## 2014-06-10 DIAGNOSIS — M545 Low back pain, unspecified: Secondary | ICD-10-CM

## 2014-06-10 DIAGNOSIS — M79604 Pain in right leg: Secondary | ICD-10-CM

## 2014-06-10 MED ORDER — METHYLPREDNISOLONE ACETATE 40 MG/ML INJ SUSP (RADIOLOG
120.0000 mg | Freq: Once | INTRAMUSCULAR | Status: AC
Start: 2014-06-10 — End: 2014-06-10
  Administered 2014-06-10: 120 mg via EPIDURAL

## 2014-06-10 MED ORDER — IOHEXOL 180 MG/ML  SOLN
1.0000 mL | Freq: Once | INTRAMUSCULAR | Status: AC | PRN
  Administered 2014-06-10: 1 mL via EPIDURAL

## 2014-06-22 ENCOUNTER — Other Ambulatory Visit: Payer: Self-pay | Admitting: Sports Medicine

## 2014-06-22 DIAGNOSIS — M79604 Pain in right leg: Secondary | ICD-10-CM

## 2014-06-22 DIAGNOSIS — M5126 Other intervertebral disc displacement, lumbar region: Secondary | ICD-10-CM

## 2014-07-05 ENCOUNTER — Ambulatory Visit
Admission: RE | Admit: 2014-07-05 | Discharge: 2014-07-05 | Disposition: A | Discharge status: Home / Self Care | Payer: Commercial Managed Care - PPO | Source: Ambulatory Visit | Attending: Sports Medicine | Admitting: Sports Medicine

## 2014-07-05 ENCOUNTER — Other Ambulatory Visit: Payer: Self-pay | Admitting: Sports Medicine

## 2014-07-05 DIAGNOSIS — M5126 Other intervertebral disc displacement, lumbar region: Secondary | ICD-10-CM

## 2014-07-05 DIAGNOSIS — M79604 Pain in right leg: Secondary | ICD-10-CM

## 2014-07-05 MED ORDER — IOHEXOL 180 MG/ML  SOLN
1.0000 mL | Freq: Once | INTRAMUSCULAR | Status: AC | PRN
  Administered 2014-07-05: 1 mL via EPIDURAL

## 2014-07-05 MED ORDER — METHYLPREDNISOLONE ACETATE 40 MG/ML INJ SUSP (RADIOLOG
120.0000 mg | Freq: Once | INTRAMUSCULAR | Status: AC
  Administered 2014-07-05: 120 mg via EPIDURAL

## 2014-08-05 ENCOUNTER — Telehealth: Payer: Self-pay | Admitting: *Deleted

## 2014-08-05 NOTE — Telephone Encounter (Signed)
Prior authorization for Viagra initiated. Awaiting determination. JG//CMA

## 2014-08-09 NOTE — Telephone Encounter (Signed)
No history of BPH, nothing I can do

## 2014-08-09 NOTE — Telephone Encounter (Signed)
PA denied. Insurance states they will not cover Viagra (or any other ED medications) for the diagnosis of ED. They will cover for BPH. Please advise. JG//CMA

## 2014-08-11 NOTE — Telephone Encounter (Signed)
LM for pt to return call. JG//CMA 

## 2014-08-13 ENCOUNTER — Telehealth: Payer: Self-pay | Admitting: Internal Medicine

## 2014-08-13 NOTE — Telephone Encounter (Signed)
Pt's insurance denied coverage for Viagra, 08/03/2014 per Lamount Cohen note. As long as Pt is aware that it will be completely out of pocket I will send refill request to Dr. Larose Kells.

## 2014-08-13 NOTE — Telephone Encounter (Signed)
Caller name: Johnston, Maddocks Relation to pt: self  Call back number: [phone] Pharmacy: [id number] - [place], [place] [phone] (Phone) [phone] (Fax)         Reason for call:  Pt requesting a 1 month supply to hold him over to next appointment refill sildenafil (VIAGRA) 100 MG tablet [facility] member ID# [id number] Group # [id number]

## 2014-08-17 DIAGNOSIS — N400 Enlarged prostate without lower urinary tract symptoms: Secondary | ICD-10-CM | POA: Insufficient documentation

## 2014-08-17 NOTE — Telephone Encounter (Signed)
Pt returned call concerning the reason for denial of Viagra. I attempted to let pt know that they will cover medication for a dx of BPH and pt does not have this. Before I could explain any further, pt began to use foul language. I asked pt to please not use that type of language with me and he continued. I disconnected the call. I let [place] and Stanton Kidney know what happened and instructed the schedulers to please transfer call to [place] or Stanton Kidney if pt calls back.

## 2014-08-19 NOTE — Telephone Encounter (Signed)
Per Dr Larose Kells request, the discharge process has been initiated for this patient. Letter printed and placed for signature.

## 2014-08-23 ENCOUNTER — Telehealth: Payer: Self-pay | Admitting: Internal Medicine

## 2014-08-23 NOTE — Telephone Encounter (Addendum)
Patient dismissed from [facility] by Kathlene November MD , effective August 19, 2014. Dismissal letter sent out by certified / registered mail. DAJ  08/23/14  Received signed domestic return receipt verifying delivery of certified letter on September 15, 2014. Article number [id number] [place] DAJ 09/17/14

## 2014-09-07 ENCOUNTER — Telehealth: Payer: Self-pay

## 2014-09-07 NOTE — Telephone Encounter (Signed)
Per Dr. Larose Kells, Pt has been dismissed from practice, no need to see Pt unless an emergency.

## 2014-09-08 ENCOUNTER — Ambulatory Visit: Payer: Commercial Managed Care - PPO | Admitting: Internal Medicine

## 2014-09-30 ENCOUNTER — Other Ambulatory Visit: Payer: Self-pay | Admitting: Internal Medicine

## 2015-03-30 ENCOUNTER — Other Ambulatory Visit: Payer: Self-pay | Admitting: Internal Medicine

## 2020-02-24 ENCOUNTER — Other Ambulatory Visit (HOSPITAL_BASED_OUTPATIENT_CLINIC_OR_DEPARTMENT_OTHER): Payer: Self-pay

## 2020-02-24 DIAGNOSIS — G4733 Obstructive sleep apnea (adult) (pediatric): Secondary | ICD-10-CM

## 2020-03-07 ENCOUNTER — Ambulatory Visit (HOSPITAL_BASED_OUTPATIENT_CLINIC_OR_DEPARTMENT_OTHER): Payer: Medicare Other | Attending: Otolaryngology | Admitting: Internal Medicine
